# Patient Record
Sex: Female | Born: 1980 | Hispanic: Yes | Marital: Married | Smoking: Never smoker
Health system: Southern US, Community
[De-identification: ages and names within clinical notes are randomized; demographics above are authoritative.]

## PROBLEM LIST (undated history)

## (undated) DIAGNOSIS — O0930 Supervision of pregnancy with insufficient antenatal care, unspecified trimester: Secondary | ICD-10-CM

## (undated) DIAGNOSIS — K8020 Calculus of gallbladder without cholecystitis without obstruction: Secondary | ICD-10-CM

## (undated) HISTORY — PX: NO PAST SURGERIES: SHX2092

---

## 2004-11-08 ENCOUNTER — Inpatient Hospital Stay (HOSPITAL_COMMUNITY): Admission: AD | Admit: 2004-11-08 | Discharge: 2004-11-10 | Payer: Self-pay | Admitting: Obstetrics and Gynecology

## 2004-11-08 ENCOUNTER — Ambulatory Visit: Payer: Self-pay | Admitting: *Deleted

## 2010-04-17 ENCOUNTER — Inpatient Hospital Stay (HOSPITAL_COMMUNITY)
Admission: AD | Admit: 2010-04-17 | Discharge: 2010-04-17 | Disposition: A | Discharge status: Home / Self Care | Payer: Self-pay | Source: Ambulatory Visit | Attending: Obstetrics | Admitting: Obstetrics

## 2010-04-17 DIAGNOSIS — O368190 Decreased fetal movements, unspecified trimester, not applicable or unspecified: Secondary | ICD-10-CM | POA: Insufficient documentation

## 2010-06-04 ENCOUNTER — Inpatient Hospital Stay (HOSPITAL_COMMUNITY)
Admission: RE | Admit: 2010-06-04 | Discharge: 2010-06-06 | DRG: 775 | Disposition: A | Discharge status: Home / Self Care | Payer: Medicaid Other | Source: Ambulatory Visit | Attending: Obstetrics | Admitting: Obstetrics

## 2010-06-04 LAB — RPR: RPR Ser Ql: NONREACTIVE

## 2010-06-04 LAB — CBC
Hemoglobin: 10.5 g/dL — ABNORMAL LOW (ref 12.0–15.0)
Platelets: 181 10*3/uL (ref 150–400)
RBC: 3.88 MIL/uL (ref 3.87–5.11)
WBC: 6.3 10*3/uL (ref 4.0–10.5)

## 2010-06-05 LAB — CBC
Hemoglobin: 8.9 g/dL — ABNORMAL LOW (ref 12.0–15.0)
RBC: 3.29 MIL/uL — ABNORMAL LOW (ref 3.87–5.11)

## 2011-07-21 ENCOUNTER — Emergency Department (HOSPITAL_COMMUNITY)
Admission: EM | Admit: 2011-07-21 | Discharge: 2011-07-21 | Disposition: A | Discharge status: Home / Self Care | Payer: Self-pay | Attending: Emergency Medicine | Admitting: Emergency Medicine

## 2011-07-21 ENCOUNTER — Encounter (HOSPITAL_COMMUNITY): Payer: Self-pay | Admitting: Emergency Medicine

## 2011-07-21 DIAGNOSIS — R109 Unspecified abdominal pain: Secondary | ICD-10-CM | POA: Insufficient documentation

## 2011-07-21 DIAGNOSIS — N390 Urinary tract infection, site not specified: Secondary | ICD-10-CM | POA: Insufficient documentation

## 2011-07-21 LAB — POCT I-STAT, CHEM 8
Calcium, Ion: 1.13 mmol/L (ref 1.12–1.32)
Chloride: 104 mEq/L (ref 96–112)
HCT: 37 % (ref 36.0–46.0)
Potassium: 3.1 mEq/L — ABNORMAL LOW (ref 3.5–5.1)

## 2011-07-21 LAB — URINALYSIS, ROUTINE W REFLEX MICROSCOPIC
Glucose, UA: NEGATIVE mg/dL
Specific Gravity, Urine: 1.018 (ref 1.005–1.030)
pH: 6 (ref 5.0–8.0)

## 2011-07-21 LAB — URINE MICROSCOPIC-ADD ON

## 2011-07-21 LAB — POCT PREGNANCY, URINE: Preg Test, Ur: NEGATIVE

## 2011-07-21 LAB — WET PREP, GENITAL
Clue Cells Wet Prep HPF POC: NONE SEEN
Trich, Wet Prep: NONE SEEN
Yeast Wet Prep HPF POC: NONE SEEN

## 2011-07-21 MED ORDER — PHENAZOPYRIDINE HCL 200 MG PO TABS: 200.0000 mg | ORAL_TABLET | Freq: Three times a day (TID) | ORAL | Status: AC

## 2011-07-21 MED ORDER — CIPROFLOXACIN HCL 500 MG PO TABS: 500.0000 mg | ORAL_TABLET | Freq: Two times a day (BID) | ORAL | Status: AC

## 2011-07-21 MED ORDER — POTASSIUM CHLORIDE CRYS ER 20 MEQ PO TBCR
EXTENDED_RELEASE_TABLET | ORAL | Status: AC
  Filled 2011-07-21: qty 2

## 2011-07-21 MED ORDER — POTASSIUM CHLORIDE CRYS ER 20 MEQ PO TBCR
40.0000 meq | EXTENDED_RELEASE_TABLET | Freq: Once | ORAL | Status: AC
  Administered 2011-07-21: 40 meq via ORAL

## 2011-07-21 NOTE — ED Notes (Signed)
Using interpreter services [phone]. Pt c/o feeling abdominal cramping like her period is going to start but does not happen. Pt took motrin x 2 yesterday without relief. Pt reports nausea but does not vomit.

## 2011-07-21 NOTE — ED Provider Notes (Signed)
History     CSN: [id number]  Arrival date & time 07/21/11  1201   First MD Initiated Contact with Patient 07/21/11 1309      Chief Complaint  Patient presents with  . Abdominal Pain    (Consider location/radiation/quality/duration/timing/severity/associated sxs/prior treatment) HPI  Patient presents to the emergency department with complaints of abdominal cramping and bilateral lower abdomen. She states it feels like she's going to start her. She's only having mild bleeding. The patient is concerned that she may be pregnant and has not taken a pregnancy test at home. The patient took Motrin this morning which helped relieve the pain. She currently is having no pain. She denies history of abdominal pains or surgeries. Pt in NAD and VSS.  History reviewed. No pertinent past medical history.  History reviewed. No pertinent past surgical history.  History reviewed. No pertinent family history.  History  Substance Use Topics  . Smoking status: Never Smoker   . Smokeless tobacco: Not on file  . Alcohol Use: No    OB History    Grav Para Term Preterm Abortions TAB SAB Ect Mult Living                  Review of Systems  HEENT: denies blurry vision or change in hearing PULMONARY: Denies difficulty breathing and SOB CARDIAC: denies chest pain or heart palpitations MUSCULOSKELETAL:  denies being unable to ambulate ABDOMEN AL: denies abdominal pain, N,V,D GU: denies loss of bowel or urinary control NEURO: denies numbness and tingling in extremities SKIN: no new rashes PSYCH: patient denies anxiety or depression. NECK: Pt denies having neck pain    Allergies  Review of patient's allergies indicates no known allergies.  Home Medications   Current Outpatient Rx  Name Route Sig Dispense Refill  . MOTRIN PO Oral Take 2 tablets by mouth every 8 (eight) hours as needed. For pain    . CIPROFLOXACIN HCL 500 MG PO TABS Oral Take 1 tablet (500 mg total) by mouth 2 (two) times  daily. 14 tablet 0  . PHENAZOPYRIDINE HCL 200 MG PO TABS Oral Take 1 tablet (200 mg total) by mouth 3 (three) times daily. 6 tablet 0    BP 106/70  Pulse 100  Temp 98.9 F (37.2 C) (Oral)  Resp 16  SpO2 99%  LMP 06/16/2011  Physical Exam  Nursing note and vitals reviewed. Constitutional: She appears well-developed and well-nourished. No distress.  HENT:  Head: Normocephalic and atraumatic.  Eyes: Pupils are equal, round, and reactive to light.  Neck: Normal range of motion. Neck supple.  Cardiovascular: Normal rate and regular rhythm.   Pulmonary/Chest: Effort normal.  Abdominal: Soft. She exhibits no distension. There is no tenderness. There is no rebound and no guarding.  Neurological: She is alert.  Skin: Skin is warm and dry.    ED Course  Procedures (including critical care time)  Labs Reviewed  URINALYSIS, ROUTINE W REFLEX MICROSCOPIC - Abnormal; Notable for the following:    APPearance TURBID (*)     Hgb urine dipstick LARGE (*)     Bilirubin Urine SMALL (*)     Ketones, ur 15 (*)     Protein, ur 100 (*)     Nitrite POSITIVE (*)     Leukocytes, UA LARGE (*)     All other components within normal limits  URINE MICROSCOPIC-ADD ON - Abnormal; Notable for the following:    Squamous Epithelial / LPF MANY (*)     Bacteria, UA MANY (*)  Casts GRANULAR CAST (*)     All other components within normal limits  POCT I-STAT, CHEM 8 - Abnormal; Notable for the following:    Potassium 3.1 (*)     BUN 5 (*)     Glucose, Bld 124 (*)     All other components within normal limits  POCT PREGNANCY, URINE  WET PREP, GENITAL  GC/CHLAMYDIA PROBE AMP, GENITAL  URINE CULTURE   No results found.   1. UTI (lower urinary tract infection)       MDM  Pt has UTI, her labs and work-up otherwise benign, pregnancy test is negative.  Pt given Rx for Cipro and pyridium. I discussed the patients glucose level with her and have given her a referral to an OB/Gyn to have her sugar  and urine rechecked. Also, because patient is concerned she needs a PAP.  Pt has been advised of the symptoms that warrant their return to the ED. Patient has voiced understanding and has agreed to follow-up with the PCP or specialist.         Dorthula Matas, PA 07/21/11 1459

## 2011-07-22 NOTE — ED Provider Notes (Signed)
Medical screening examination/treatment/procedure(s) were performed by non-physician practitioner and as supervising physician I was immediately available for consultation/collaboration.  Kasin Tonkinson, MD 07/22/11 1327 

## 2011-07-23 LAB — URINE CULTURE: Colony Count: >=100000

## 2011-07-24 NOTE — ED Notes (Signed)
+  Urine. Patient treated with Cipro. Sensitive to same. Per protocol MD. °

## 2011-09-28 ENCOUNTER — Emergency Department (HOSPITAL_COMMUNITY)
Admission: EM | Admit: 2011-09-28 | Discharge: 2011-09-28 | Disposition: A | Discharge status: Home / Self Care | Payer: Self-pay | Attending: Emergency Medicine | Admitting: Emergency Medicine

## 2011-09-28 ENCOUNTER — Other Ambulatory Visit (HOSPITAL_COMMUNITY): Payer: Medicaid Other

## 2011-09-28 ENCOUNTER — Emergency Department (HOSPITAL_COMMUNITY): Payer: Self-pay

## 2011-09-28 ENCOUNTER — Encounter (HOSPITAL_COMMUNITY): Payer: Self-pay | Admitting: Physical Medicine and Rehabilitation

## 2011-09-28 DIAGNOSIS — Z3490 Encounter for supervision of normal pregnancy, unspecified, unspecified trimester: Secondary | ICD-10-CM

## 2011-09-28 DIAGNOSIS — R109 Unspecified abdominal pain: Secondary | ICD-10-CM

## 2011-09-28 DIAGNOSIS — O2690 Pregnancy related conditions, unspecified, unspecified trimester: Secondary | ICD-10-CM | POA: Insufficient documentation

## 2011-09-28 DIAGNOSIS — R319 Hematuria, unspecified: Secondary | ICD-10-CM | POA: Insufficient documentation

## 2011-09-28 LAB — CBC WITH DIFFERENTIAL/PLATELET
Basophils Absolute: 0 10*3/uL (ref 0.0–0.1)
Eosinophils Relative: 2 % (ref 0–5)
HCT: 36.2 % (ref 36.0–46.0)
Lymphocytes Relative: 23 % (ref 12–46)
Lymphs Abs: 1.8 10*3/uL (ref 0.7–4.0)
MCV: 83 fL (ref 78.0–100.0)
Monocytes Absolute: 0.3 10*3/uL (ref 0.1–1.0)
RDW: 14.8 % (ref 11.5–15.5)
WBC: 7.9 10*3/uL (ref 4.0–10.5)

## 2011-09-28 LAB — HCG, QUANTITATIVE, PREGNANCY: hCG, Beta Chain, Quant, S: 23613 m[IU]/mL — ABNORMAL HIGH (ref <5)

## 2011-09-28 LAB — URINALYSIS, ROUTINE W REFLEX MICROSCOPIC
Nitrite: NEGATIVE
Specific Gravity, Urine: 1.021 (ref 1.005–1.030)
Urobilinogen, UA: 0.2 mg/dL (ref 0.0–1.0)

## 2011-09-28 LAB — URINE MICROSCOPIC-ADD ON

## 2011-09-28 LAB — COMPREHENSIVE METABOLIC PANEL
BUN: 7 mg/dL (ref 6–23)
CO2: 23 mEq/L (ref 19–32)
Calcium: 8.8 mg/dL (ref 8.4–10.5)
Creatinine, Ser: 0.44 mg/dL — ABNORMAL LOW (ref 0.50–1.10)
GFR calc Af Amer: >90 mL/min (ref >90)
GFR calc non Af Amer: >90 mL/min (ref >90)
Glucose, Bld: 107 mg/dL — ABNORMAL HIGH (ref 70–99)

## 2011-09-28 LAB — WET PREP, GENITAL

## 2011-09-28 MED ORDER — HYDROCODONE-ACETAMINOPHEN 5-500 MG PO TABS: 1.0000 | ORAL_TABLET | Freq: Four times a day (QID) | ORAL | Status: AC | PRN

## 2011-09-28 MED ORDER — ONDANSETRON HCL 4 MG/2ML IJ SOLN
4.0000 mg | Freq: Once | INTRAMUSCULAR | Status: AC
  Administered 2011-09-28: 4 mg via INTRAVENOUS
  Filled 2011-09-28: qty 2

## 2011-09-28 NOTE — ED Notes (Signed)
Pt presents to department for evaluation of lower abdominal pain, localized to RLQ. Ongoing since 08:30 this morning. States 10/10 pressure sensation. Denies urinary symptoms. Denies vaginal bleeding. Was seen at health department recently, she is x10weeks pregnant. Lower abdomen tender to palpation. Bowel sounds present all quadrants. She is alert and oriented x4. Husband at bedside to translate. No signs of distress noted at the time.

## 2011-09-28 NOTE — ED Notes (Signed)
Pt presents to department for evaluation of lower abdominal pain. Onset this morning @08 :30. 10/10 pain upon arrival. Also states nausea. She is x10weeks pregnant. LMP: 07/12/2011. Last BM this morning. Denies urinary symptoms. She is conscious alert and oriented x4. Husband at bedside to translate.

## 2011-09-28 NOTE — ED Provider Notes (Signed)
History     CSN: [id number]  Arrival date & time 09/28/11  7829   First MD Initiated Contact with Patient 09/28/11 [phone]      Chief Complaint  Patient presents with  . Abdominal Pain    (Consider location/radiation/quality/duration/timing/severity/associated sxs/prior treatment) Patient is a 31 y.o. female presenting with abdominal pain. The history is provided by the patient.  Abdominal Pain The primary symptoms of the illness include abdominal pain, nausea and vomiting. The primary symptoms of the illness do not include fever, shortness of breath, diarrhea, hematemesis, hematochezia, dysuria, vaginal discharge or vaginal bleeding. The current episode started 1 to 2 hours ago. The onset of the illness was sudden.  Symptoms associated with the illness do not include chills, frequency or back pain.  Pt states she thinks she is about [redacted]wks pregnant. States pain started this morning, is on the right lower abdomen. States pain was 10/10, but states she just vomited and pain now 1/10. Pt denies urinary or vaginal symptoms. No diarrhea.   No past medical history on file.  No past surgical history on file.  No family history on file.  History  Substance Use Topics  . Smoking status: Never Smoker   . Smokeless tobacco: Not on file  . Alcohol Use: No    OB History    Grav Para Term Preterm Abortions TAB SAB Ect Mult Living                  Review of Systems  Constitutional: Negative for fever and chills.  HENT: Negative.   Respiratory: Negative for chest tightness and shortness of breath.   Cardiovascular: Negative.   Gastrointestinal: Positive for nausea, vomiting and abdominal pain. Negative for diarrhea, hematochezia and hematemesis.  Genitourinary: Negative for dysuria, frequency, flank pain, vaginal bleeding and vaginal discharge.  Musculoskeletal: Negative for back pain.  Neurological: Negative for dizziness, weakness, light-headedness and headaches.    Allergies    Review of patient's allergies indicates no known allergies.  Home Medications   Current Outpatient Rx  Name Route Sig Dispense Refill  . FOLIC ACID 1 MG PO TABS Oral Take 1 mg by mouth daily.      BP 114/68  Pulse 56  Temp 98 F (36.7 C) (Oral)  Resp 18  SpO2 100%  Physical Exam  Constitutional: She is oriented to person, place, and time. She appears well-developed and well-nourished. No distress.  Eyes: Conjunctivae are normal.  Neck: Neck supple.  Cardiovascular: Normal rate, regular rhythm and normal heart sounds.   Pulmonary/Chest: Effort normal and breath sounds normal. No respiratory distress. She has no wheezes. She has no rales.  Abdominal: Soft. Bowel sounds are normal. She exhibits no distension. There is no tenderness. There is no rebound.  Genitourinary:       Normal external genitalia. Normal vaginal canal. Cervix normal. Uterus gravid. No CMT  Musculoskeletal: She exhibits no edema.  Neurological: She is alert and oriented to person, place, and time.  Skin: Skin is warm and dry.  Psychiatric: She has a normal mood and affect.    ED Course  Procedures (including critical care time)  Pt possibly pregnant with RLQ pain. Pt vomitted and now states pain completely resolved. Will get labs, pelvic exam, UA.  Results for orders placed during the hospital encounter of 09/28/11  CBC WITH DIFFERENTIAL      Component Value Range   WBC 7.9  4.0 - 10.5 K/uL   RBC 4.36  3.87 - 5.11 MIL/uL  Hemoglobin 12.3  12.0 - 15.0 g/dL   HCT 65.7  84.6 - 96.2 %   MCV 83.0  78.0 - 100.0 fL   MCH 28.2  26.0 - 34.0 pg   MCHC 34.0  30.0 - 36.0 g/dL   RDW 95.2  84.1 - 32.4 %   Platelets 177  150 - 400 K/uL   Neutrophils Relative 71  43 - 77 %   Neutro Abs 5.6  1.7 - 7.7 K/uL   Lymphocytes Relative 23  12 - 46 %   Lymphs Abs 1.8  0.7 - 4.0 K/uL   Monocytes Relative 4  3 - 12 %   Monocytes Absolute 0.3  0.1 - 1.0 K/uL   Eosinophils Relative 2  0 - 5 %   Eosinophils Absolute 0.1   0.0 - 0.7 K/uL   Basophils Relative 0  0 - 1 %   Basophils Absolute 0.0  0.0 - 0.1 K/uL  COMPREHENSIVE METABOLIC PANEL      Component Value Range   Sodium 136  135 - 145 mEq/L   Potassium 4.3  3.5 - 5.1 mEq/L   Chloride 103  96 - 112 mEq/L   CO2 23  19 - 32 mEq/L   Glucose, Bld 107 (*) 70 - 99 mg/dL   BUN 7  6 - 23 mg/dL   Creatinine, Ser 4.01 (*) 0.50 - 1.10 mg/dL   Calcium 8.8  8.4 - 02.7 mg/dL   Total Protein 7.0  6.0 - 8.3 g/dL   Albumin 3.4 (*) 3.5 - 5.2 g/dL   AST 23  0 - 37 U/L   ALT 18  0 - 35 U/L   Alkaline Phosphatase 53  39 - 117 U/L   Total Bilirubin 0.3  0.3 - 1.2 mg/dL   GFR calc non Af Amer >90  >90 mL/min   GFR calc Af Amer >90  >90 mL/min  URINALYSIS, ROUTINE W REFLEX MICROSCOPIC      Component Value Range   Color, Urine YELLOW  YELLOW   APPearance HAZY (*) CLEAR   Specific Gravity, Urine 1.021  1.005 - 1.030   pH 6.5  5.0 - 8.0   Glucose, UA NEGATIVE  NEGATIVE mg/dL   Hgb urine dipstick LARGE (*) NEGATIVE   Bilirubin Urine NEGATIVE  NEGATIVE   Ketones, ur NEGATIVE  NEGATIVE mg/dL   Protein, ur NEGATIVE  NEGATIVE mg/dL   Urobilinogen, UA 0.2  0.0 - 1.0 mg/dL   Nitrite NEGATIVE  NEGATIVE   Leukocytes, UA SMALL (*) NEGATIVE  WET PREP, GENITAL      Component Value Range   Yeast Wet Prep HPF POC NONE SEEN  NONE SEEN   Trich, Wet Prep NONE SEEN  NONE SEEN   Clue Cells Wet Prep HPF POC FEW (*) NONE SEEN   WBC, Wet Prep HPF POC MANY (*) NONE SEEN  POCT PREGNANCY, URINE      Component Value Range   Preg Test, Ur POSITIVE (*) NEGATIVE  ABO/RH      Component Value Range   ABO/RH(D) A POS    HCG, QUANTITATIVE, PREGNANCY      Component Value Range   hCG, Beta Chain, Quant, S 25366 (*) <5 mIU/mL  URINE MICROSCOPIC-ADD ON      Component Value Range   Squamous Epithelial / LPF FEW (*) RARE   WBC, UA 3-6  <3 WBC/hpf   RBC / HPF 11-20  <3 RBC/hpf   Bacteria, UA RARE  RARE   Urine-Other MUCOUS PRESENT  US Ob Comp Less 14 Wks  09/28/2011  *RADIOLOGY REPORT*   Clinical Data: ABDOMINAL PAIN; ;  OBSTETRIC <14 WK [place] AND TRANSVAGINAL OB US  Technique: Both transabdominal and transvaginal ultrasound examinations were performed for complete evaluation of the gestation as well as the maternal uterus, adnexal regions, and pelvic cul-de-sac.  Comparison: None  Findings: There is a single intrauterine gestation.  Based on crown- rump length, estimated gestational age is 12 weeks 4 days.  Fetal heart rate 143 beats per minute.  No subchorionic hemorrhage. Focal uterine contraction noted in the fundal area.  No adnexal masses.  Ovaries are symmetric size and echo texture. No free fluid.  IMPRESSION: 12-week-4-day intrauterine pregnancy.  Fetal heart rate 143 beats per minute.   Original Report Authenticated By: Cyndie Chime, M.D.     US obtained to r/o ectopic pregnanacy. [place] confirms live intrauterine pregnancy. Will d/c home with OB/GYN follow up. I suspect pt may have had a kidney stone based on RBCs in urine, and pain completely resolved with no medications. Interpreter phone used to discuss, results, plan, and pt voiced understanding.   1. Abdominal pain   2. Hematuria   3. Intrauterine pregnancy       MDM          Lottie Mussel, PA 09/28/11 [phone]

## 2011-09-29 LAB — GC/CHLAMYDIA PROBE AMP, GENITAL
Chlamydia, DNA Probe: NEGATIVE
GC Probe Amp, Genital: NEGATIVE

## 2011-09-29 NOTE — ED Provider Notes (Signed)
Medical screening examination/treatment/procedure(s) were performed by non-physician practitioner and as supervising physician I was immediately available for consultation/collaboration.   Carleene Cooper III, MD 09/29/11 1040

## 2011-12-30 ENCOUNTER — Other Ambulatory Visit: Payer: Self-pay | Admitting: Family Medicine

## 2011-12-30 DIAGNOSIS — Z3689 Encounter for other specified antenatal screening: Secondary | ICD-10-CM

## 2011-12-31 ENCOUNTER — Ambulatory Visit (HOSPITAL_COMMUNITY)
Admission: RE | Admit: 2011-12-31 | Discharge: 2011-12-31 | Disposition: A | Discharge status: Home / Self Care | Payer: Medicaid Other | Source: Ambulatory Visit | Attending: Family Medicine | Admitting: Family Medicine

## 2011-12-31 DIAGNOSIS — Z3689 Encounter for other specified antenatal screening: Secondary | ICD-10-CM | POA: Insufficient documentation

## 2012-01-20 NOTE — L&D Delivery Note (Signed)
Delivery Note At 6:18 PM a healthy female was delivered via Vaginal, Spontaneous Delivery (Presentation: Right Occiput Anterior).  APGAR: 8, 9; weight .   Placenta status: Intact, Spontaneous.  Cord: 3 vessels with the following complications: None.  Cord pH: n/a  Anesthesia: None  Episiotomy: None Lacerations: None Suture Repair: None Est. Blood Loss (mL): 300  Mom to postpartum.  Baby to nursery-stable.  Kevin Fenton 04/08/2012, 7:25 PM

## 2012-01-20 NOTE — L&D Delivery Note (Signed)
I was present for entire delivery and agree with above note. Napoleon Form, MD

## 2012-04-08 ENCOUNTER — Inpatient Hospital Stay (HOSPITAL_COMMUNITY)
Admission: AD | Admit: 2012-04-08 | Discharge: 2012-04-09 | DRG: 775 | Disposition: A | Discharge status: Home / Self Care | Payer: Medicaid Other | Source: Ambulatory Visit | Attending: Obstetrics and Gynecology | Admitting: Obstetrics and Gynecology

## 2012-04-08 ENCOUNTER — Encounter (HOSPITAL_COMMUNITY): Payer: Self-pay

## 2012-04-08 ENCOUNTER — Inpatient Hospital Stay (HOSPITAL_COMMUNITY): Payer: Medicaid Other

## 2012-04-08 HISTORY — DX: Calculus of gallbladder without cholecystitis without obstruction: K80.20

## 2012-04-08 HISTORY — DX: Supervision of pregnancy with insufficient antenatal care, unspecified trimester: O09.30

## 2012-04-08 LAB — TYPE AND SCREEN
ABO/RH(D): A POS
Antibody Screen: NEGATIVE

## 2012-04-08 LAB — RPR: RPR Ser Ql: NONREACTIVE

## 2012-04-08 LAB — CBC
MCH: 28.2 pg (ref 26.0–34.0)
MCHC: 33 g/dL (ref 30.0–36.0)
Platelets: 172 10*3/uL (ref 150–400)

## 2012-04-08 LAB — ABO/RH: ABO/RH(D): A POS

## 2012-04-08 MED ORDER — LACTATED RINGERS IV SOLN
500.0000 mL | INTRAVENOUS | Status: DC | PRN
  Administered 2012-04-08: 1000 mL via INTRAVENOUS

## 2012-04-08 MED ORDER — ONDANSETRON HCL 4 MG/2ML IJ SOLN: 4.0000 mg | Freq: Four times a day (QID) | INTRAMUSCULAR | Status: DC | PRN

## 2012-04-08 MED ORDER — NALBUPHINE SYRINGE 5 MG/0.5 ML
10.0000 mg | INJECTION | INTRAMUSCULAR | Status: DC | PRN
  Filled 2012-04-08: qty 1

## 2012-04-08 MED ORDER — OXYTOCIN 40 UNITS IN LACTATED RINGERS INFUSION - SIMPLE MED
1.0000 m[IU]/min | INTRAVENOUS | Status: DC
  Administered 2012-04-08: 2 m[IU]/min via INTRAVENOUS
  Filled 2012-04-08: qty 1000

## 2012-04-08 MED ORDER — SENNOSIDES-DOCUSATE SODIUM 8.6-50 MG PO TABS
2.0000 | ORAL_TABLET | Freq: Every day | ORAL | Status: DC
  Administered 2012-04-08: 2 via ORAL

## 2012-04-08 MED ORDER — OXYCODONE-ACETAMINOPHEN 5-325 MG PO TABS: 1.0000 | ORAL_TABLET | ORAL | Status: DC | PRN

## 2012-04-08 MED ORDER — LACTATED RINGERS IV SOLN
INTRAVENOUS | Status: DC
  Administered 2012-04-08: 14:00:00 via INTRAVENOUS

## 2012-04-08 MED ORDER — PRENATAL MULTIVITAMIN CH
1.0000 | ORAL_TABLET | Freq: Every day | ORAL | Status: DC
  Administered 2012-04-09: 1 via ORAL
  Filled 2012-04-08: qty 1

## 2012-04-08 MED ORDER — IBUPROFEN 600 MG PO TABS
600.0000 mg | ORAL_TABLET | Freq: Four times a day (QID) | ORAL | Status: DC
  Administered 2012-04-08 – 2012-04-09 (×4): 600 mg via ORAL
  Filled 2012-04-08 (×4): qty 1

## 2012-04-08 MED ORDER — ACETAMINOPHEN 325 MG PO TABS: 650.0000 mg | ORAL_TABLET | ORAL | Status: DC | PRN

## 2012-04-08 MED ORDER — ONDANSETRON HCL 4 MG PO TABS: 4.0000 mg | ORAL_TABLET | ORAL | Status: DC | PRN

## 2012-04-08 MED ORDER — SIMETHICONE 80 MG PO CHEW: 80.0000 mg | CHEWABLE_TABLET | ORAL | Status: DC | PRN

## 2012-04-08 MED ORDER — FLEET ENEMA 7-19 GM/118ML RE ENEM: 1.0000 | ENEMA | RECTAL | Status: DC | PRN

## 2012-04-08 MED ORDER — CITRIC ACID-SODIUM CITRATE 334-500 MG/5ML PO SOLN: 30.0000 mL | ORAL | Status: DC | PRN

## 2012-04-08 MED ORDER — ZOLPIDEM TARTRATE 5 MG PO TABS: 5.0000 mg | ORAL_TABLET | Freq: Every evening | ORAL | Status: DC | PRN

## 2012-04-08 MED ORDER — TETANUS-DIPHTH-ACELL PERTUSSIS 5-2.5-18.5 LF-MCG/0.5 IM SUSP: 0.5000 mL | Freq: Once | INTRAMUSCULAR | Status: DC

## 2012-04-08 MED ORDER — DIBUCAINE 1 % RE OINT: 1.0000 "application " | TOPICAL_OINTMENT | RECTAL | Status: DC | PRN

## 2012-04-08 MED ORDER — DIPHENHYDRAMINE HCL 25 MG PO CAPS: 25.0000 mg | ORAL_CAPSULE | Freq: Four times a day (QID) | ORAL | Status: DC | PRN

## 2012-04-08 MED ORDER — BENZOCAINE-MENTHOL 20-0.5 % EX AERO: 1.0000 "application " | INHALATION_SPRAY | CUTANEOUS | Status: DC | PRN

## 2012-04-08 MED ORDER — LIDOCAINE HCL (PF) 1 % IJ SOLN: 30.0000 mL | INTRAMUSCULAR | Status: DC | PRN

## 2012-04-08 MED ORDER — IBUPROFEN 600 MG PO TABS: 600.0000 mg | ORAL_TABLET | Freq: Four times a day (QID) | ORAL | Status: DC | PRN

## 2012-04-08 MED ORDER — TERBUTALINE SULFATE 1 MG/ML IJ SOLN: 0.2500 mg | Freq: Once | INTRAMUSCULAR | Status: DC | PRN

## 2012-04-08 MED ORDER — LANOLIN HYDROUS EX OINT: TOPICAL_OINTMENT | CUTANEOUS | Status: DC | PRN

## 2012-04-08 MED ORDER — OXYTOCIN 40 UNITS IN LACTATED RINGERS INFUSION - SIMPLE MED: 62.5000 mL/h | INTRAVENOUS | Status: DC

## 2012-04-08 MED ORDER — ONDANSETRON HCL 4 MG/2ML IJ SOLN: 4.0000 mg | INTRAMUSCULAR | Status: DC | PRN

## 2012-04-08 MED ORDER — WITCH HAZEL-GLYCERIN EX PADS: 1.0000 "application " | MEDICATED_PAD | CUTANEOUS | Status: DC | PRN

## 2012-04-08 MED ORDER — OXYTOCIN BOLUS FROM INFUSION
500.0000 mL | INTRAVENOUS | Status: DC
  Administered 2012-04-08: 500 mL via INTRAVENOUS

## 2012-04-08 NOTE — H&P (Signed)
Teresa Ramos is a 32 y.o. female [phone] here at 40.1 sent from the health department for a prolonged decels. She denies vaginal bleeding/LOF/discharge/irritation, decreased fetal movement, and contractions. She has not had any complications during her preganancy and denies other medical problems. She had one previous pre-term delivery at 36 weeks. She denies headaches, change in vison, chest pain, dyspnea, RUQ pain, dysuria, and swelling.   History   OB History   Grav Para Term Preterm Abortions TAB SAB Ect Mult Living   4 3 2 1      3      Past Medical History  Diagnosis Date  . Preterm labor   . Late prenatal care    Past Surgical History  Procedure Laterality Date  . No past surgeries     Family History: family history is not on file. Social History:  reports that she has never smoked. She has never used smokeless tobacco. She reports that she does not drink alcohol or use illicit drugs.   Prenatal Transfer Tool  Maternal Diabetes: No Genetic Screening: Normal Maternal Ultrasounds/Referrals: Normal Fetal Ultrasounds or other Referrals:  None Maternal Substance Abuse:  No Significant Maternal Medications:  None Significant Maternal Lab Results:  Lab values include: Group B Strep negative Other Comments:  None  ROS Per HPI      Blood pressure 102/56, pulse 80, temperature 97.6 F (36.4 C), temperature source Oral, resp. rate 16, last menstrual period 06/16/2011, SpO2 100.00%. Exam Physical Exam    Gen: NAD, alert, cooperative with exam  HEENT: NCAT, EOMI, PERRL  CV: RRR, good S1/S2, no murmur  Resp: CTABL, no wheezes, non-labored  Abd: Soft, pregnant abdomen  Ext: No edema, warm  Neuro: Alert and oriented, No gross deficits   FHT: baseline 120, moderate variability, accels present, no decels Toco: irreg q 2-4 minutes  Dilation: 3 Effacement (%): 50 Station: -2 Presentation: Vertex Exam by:: Thad Ranger, MD  Prenatal labs: ABO, Rh: --/--/A POS (03/21  1410) Antibody:  negative Rubella:  immune RPR:   negative HBsAg:   negative HIV:   non-reactive GBS:   negative     Assessment/Plan: 32 y/o [id number] here at 40.1 for induction due to 6/8 BPP and prolonged decels at the health department this am.   - BPP 6/8, AFI n 30th %tile - Fetal strip category 1 - Will admit and induce due to term pregnancy with some decels and 6/8 on BPP, favorable cervix--> start pitocin - GBS negative - labor support without pain meds for now - Anticipate SVD   Kevin Fenton 04/08/2012, 1:21 PM  I saw and examined patient and reviewed prenatal records, fetal heart tracing, labs, imaging. Baby initially tachycardic in 170s but baseline returned to 120s prior to BPP. i also reviewed NST strip sent from health department with 4 minute decel. Given decel and BPP 6/8 and full term, will admit for induction and delivery. Anticipate SVD.  Napoleon Form, MD

## 2012-04-08 NOTE — MAU Note (Signed)
Pt notified by Dr. Thad Ranger and spanish interpreter that she will be admitted and induced. Pt verbalizes understanding. Awaiting room

## 2012-04-08 NOTE — Plan of Care (Signed)
Problem: Consults Goal: Postpartum Patient Education (See Patient Education module for education specifics.) Outcome: Completed/Met Date Met:  04/08/12 Had interpreter for spanish to explain booklet

## 2012-04-08 NOTE — MAU Note (Signed)
Per Eda, spanish translator, pt sent from [facility], denies pain or bleeding. No ctx's per pt.

## 2012-04-09 MED ORDER — IBUPROFEN 600 MG PO TABS: 600.0000 mg | ORAL_TABLET | Freq: Four times a day (QID) | ORAL | Status: AC

## 2012-04-09 MED ORDER — DOCUSATE SODIUM 100 MG PO CAPS: 100.0000 mg | ORAL_CAPSULE | Freq: Two times a day (BID) | ORAL | Status: DC | PRN

## 2012-04-09 NOTE — Discharge Summary (Signed)
Obstetric Discharge Summary Teresa Ramos is a 32 y.o. [id number] presenting at [redacted]w[redacted]d for NST after prolonged decel at [facility] Department appointment. Patient had 6/8 BPP and was admitted for induction of labor. She progressed rapidly on pitocin to complete dilation and SVD without epidural. Her post-partum course was unremarkable. She is breastfeeding and plans Nexplanon for contraception.  Reason for Admission: induction of labor and for non-reassuring fetal status Prenatal Procedures: NST, BPP Intrapartum Procedures: spontaneous vaginal delivery Postpartum Procedures: none Complications-Operative and Postpartum: none Hemoglobin  Date Value Range Status  04/08/2012 10.7* 12.0 - 15.0 g/dL Final     HCT  Date Value Range Status  04/08/2012 32.4* 36.0 - 46.0 % Final    Physical Exam:  General: alert, cooperative and no distress Lochia: appropriate Uterine Fundus: firm Incision: n/a DVT Evaluation: No evidence of DVT seen on physical exam. Negative Homan's sign. No cords or calf tenderness. No significant calf/ankle edema.  Discharge Diagnoses: Term Pregnancy-delivered  Discharge Information: Date: 04/09/2012 Activity: pelvic rest Diet: routine Medications: PNV, Ibuprofen and Colace Condition: stable Instructions: refer to practice specific booklet Discharge to: home Follow-up Information   Follow up with [facility] (as previously scheduled)    Contact information:   [address] [place] [address] [phone]      Newborn Data: Live born female  Birth Weight: 7 lb 15 oz (3600 g) APGAR: 8, 9  Home with mother.  Napoleon Form 04/09/2012, 8:59 PM

## 2012-04-09 NOTE — Progress Notes (Signed)
Post Partum Day #1 Subjective: no complaints and up ad lib; breastfeeding but reports 'no milk'; desires Nexplanon for contraception  Objective: Blood pressure 100/52, pulse 72, temperature 97.9 F (36.6 C), temperature source Oral, resp. rate 18, height 5\' 2"  (1.575 m), weight 191 lb (86.637 kg), last menstrual period 06/16/2011, SpO2 99.00%, unknown if currently breastfeeding.  Physical Exam:  General: alert, cooperative and no distress Lochia: appropriate Uterine Fundus: firm DVT Evaluation: No evidence of DVT seen on physical exam.   Recent Labs  04/08/12 1410  HGB 10.7*  HCT 32.4*    Assessment/Plan: Plan for discharge tomorrow and Lactation consult Educated re breastfeeding/milk supply   LOS: 1 day   Cam Hai 04/09/2012, 7:31 AM

## 2012-04-11 NOTE — Progress Notes (Signed)
Post discharge chart review completed.  

## 2013-11-20 ENCOUNTER — Encounter (HOSPITAL_COMMUNITY): Payer: Self-pay

## 2019-03-24 ENCOUNTER — Other Ambulatory Visit: Payer: Self-pay

## 2019-03-24 DIAGNOSIS — Z1231 Encounter for screening mammogram for malignant neoplasm of breast: Secondary | ICD-10-CM

## 2019-06-15 ENCOUNTER — Inpatient Hospital Stay: Admission: RE | Admit: 2019-06-15 | Payer: Self-pay | Source: Ambulatory Visit

## 2019-06-15 ENCOUNTER — Other Ambulatory Visit: Payer: Self-pay | Admitting: Obstetrics & Gynecology

## 2019-06-15 DIAGNOSIS — Z1231 Encounter for screening mammogram for malignant neoplasm of breast: Secondary | ICD-10-CM

## 2020-08-06 ENCOUNTER — Other Ambulatory Visit: Payer: Self-pay | Admitting: Obstetrics & Gynecology

## 2020-08-06 DIAGNOSIS — Z1231 Encounter for screening mammogram for malignant neoplasm of breast: Secondary | ICD-10-CM

## 2020-09-16 ENCOUNTER — Other Ambulatory Visit: Payer: Self-pay | Admitting: Obstetrics & Gynecology

## 2020-09-16 DIAGNOSIS — Z1231 Encounter for screening mammogram for malignant neoplasm of breast: Secondary | ICD-10-CM

## 2020-09-17 ENCOUNTER — Ambulatory Visit
Admission: RE | Admit: 2020-09-17 | Discharge: 2020-09-17 | Disposition: A | Discharge status: Home / Self Care | Payer: No Typology Code available for payment source | Source: Ambulatory Visit | Attending: Obstetrics & Gynecology | Admitting: Obstetrics & Gynecology

## 2020-09-17 ENCOUNTER — Other Ambulatory Visit: Payer: Self-pay

## 2020-09-17 DIAGNOSIS — Z1231 Encounter for screening mammogram for malignant neoplasm of breast: Secondary | ICD-10-CM

## 2020-09-25 ENCOUNTER — Other Ambulatory Visit: Payer: Self-pay | Admitting: Obstetrics and Gynecology

## 2020-09-25 DIAGNOSIS — R928 Other abnormal and inconclusive findings on diagnostic imaging of breast: Secondary | ICD-10-CM

## 2020-10-10 ENCOUNTER — Encounter (INDEPENDENT_AMBULATORY_CARE_PROVIDER_SITE_OTHER): Payer: Self-pay

## 2020-10-10 ENCOUNTER — Ambulatory Visit: Payer: Self-pay | Admitting: *Deleted

## 2020-10-10 ENCOUNTER — Other Ambulatory Visit: Payer: Self-pay

## 2020-10-10 ENCOUNTER — Ambulatory Visit
Admission: RE | Admit: 2020-10-10 | Discharge: 2020-10-10 | Disposition: A | Discharge status: Home / Self Care | Payer: No Typology Code available for payment source | Source: Ambulatory Visit | Attending: Obstetrics and Gynecology | Admitting: Obstetrics and Gynecology

## 2020-10-10 VITALS — BP 110/78 | Wt 195.0 lb

## 2020-10-10 DIAGNOSIS — Z1239 Encounter for other screening for malignant neoplasm of breast: Secondary | ICD-10-CM

## 2020-10-10 DIAGNOSIS — R928 Other abnormal and inconclusive findings on diagnostic imaging of breast: Secondary | ICD-10-CM

## 2020-10-10 NOTE — Progress Notes (Signed)
Teresa Ramos is a 40 y.o. female who presents to [facility] clinic today with no complaints. Patient referred to [facility] by the [facility] due to having a screening mammogram 09/17/2020 that additional imaging of the right breast is recommended for follow-up.   Pap Smear: Pap smear not completed today. Last Pap smear was 03/21/2019 at the [facility] Department clinic and was normal with negative HPV per Berniece Salines, RN at the [facility] Department. Per patient has no history of an abnormal Pap smear. Last Pap smear result is not available in Epic.   Physical exam: Breasts Breasts symmetrical. No skin abnormalities bilateral breasts. No nipple retraction bilateral breasts. No nipple discharge bilateral breasts. No lymphadenopathy. No lumps palpated bilateral breasts. No complaints of pain or tenderness on exam.     MM 3D SCREEN BREAST BILATERAL  Result Date: 09/24/2020 CLINICAL DATA:  Screening. EXAM: DIGITAL SCREENING BILATERAL MAMMOGRAM WITH TOMOSYNTHESIS AND CAD TECHNIQUE: Bilateral screening digital craniocaudal and mediolateral oblique mammograms were obtained. Bilateral screening digital breast tomosynthesis was performed. The images were evaluated with computer-aided detection. COMPARISON:  None. ACR Breast Density Category c: The breast tissue is heterogeneously dense, which may obscure small masses. FINDINGS: In the right breast, a possible mass warrants further evaluation. In the left breast, no findings suspicious for malignancy. IMPRESSION: Further evaluation is suggested for a possible mass in the right breast. RECOMMENDATION: Diagnostic mammogram and possibly ultrasound of the right breast. (Code:[id number]) The patient will be contacted regarding the findings, and additional imaging will be scheduled. BI-RADS CATEGORY  0: Incomplete. Need additional imaging evaluation and/or prior mammograms for comparison. Electronically Signed   By: Amie Portland M.D.   On:  09/24/2020 16:42    Pelvic/Bimanual Pap is not indicated today per BCCCP guidelines.   Smoking History: Patient has never smoked.   Patient Navigation: Patient education provided. Access to services provided for patient through [facility] program. Spanish interpreter Natale Lay from [facility] provided.    Breast and Cervical Cancer Risk Assessment: Patient does not have family history of breast cancer, known genetic mutations, or radiation treatment to the chest before age 31. Patient does not have history of cervical dysplasia, immunocompromised, or DES exposure in-utero.  Risk Assessment     Risk Scores       10/10/2020   Last edited by: Meryl Dare, CMA   5-year risk: 0.3 %   Lifetime risk: 5.8 %            A: BCCCP exam without pap smear No complaints.  P: [facility] right breast per recommendation. Appointment scheduled Thursday, October 10, 2020 at 1240.  Priscille Heidelberg, RN 10/10/2020 11:08 AM

## 2020-10-10 NOTE — Patient Instructions (Signed)
Explained breast self awareness with Barnetta Chapel. Patient did not need a Pap smear today due to last Pap smear and HPV typing was 03/21/2019. Let her know BCCCP will cover Pap smears and HPV typing every 5 years unless has a history of abnormal Pap smears. Referred patient to the [facility] for a diagnostic mammogram right breast per recommendation. Appointment scheduled Thursday, October 10, 2020 at 1240. Patient aware of appointment and will be there. Dawna Raysor verbalized understanding.  Tacey Dimaggio, Kathaleen Maser, RN 11:08 AM

## 2021-09-28 IMAGING — MG MM DIGITAL DIAGNOSTIC UNILAT*R* W/ TOMO W/ CAD
4 series · 4 of 12 positions shown · non-contrast
Comparison: Previous exam(s).

ACR Breast Density Category choose 3

CLINICAL DATA: The patient was called back for a right breast mass
from baseline mammography.

EXAM:
DIGITAL DIAGNOSTIC UNILATERAL RIGHT MAMMOGRAM WITH TOMOSYNTHESIS AND
CAD; ULTRASOUND RIGHT BREAST LIMITED
TECHNIQUE: Right digital diagnostic mammography and breast tomosynthesis was
performed. The images were evaluated with computer-aided detection.;
Targeted ultrasound examination of the right breast was performed

[R MLO synth-2D]
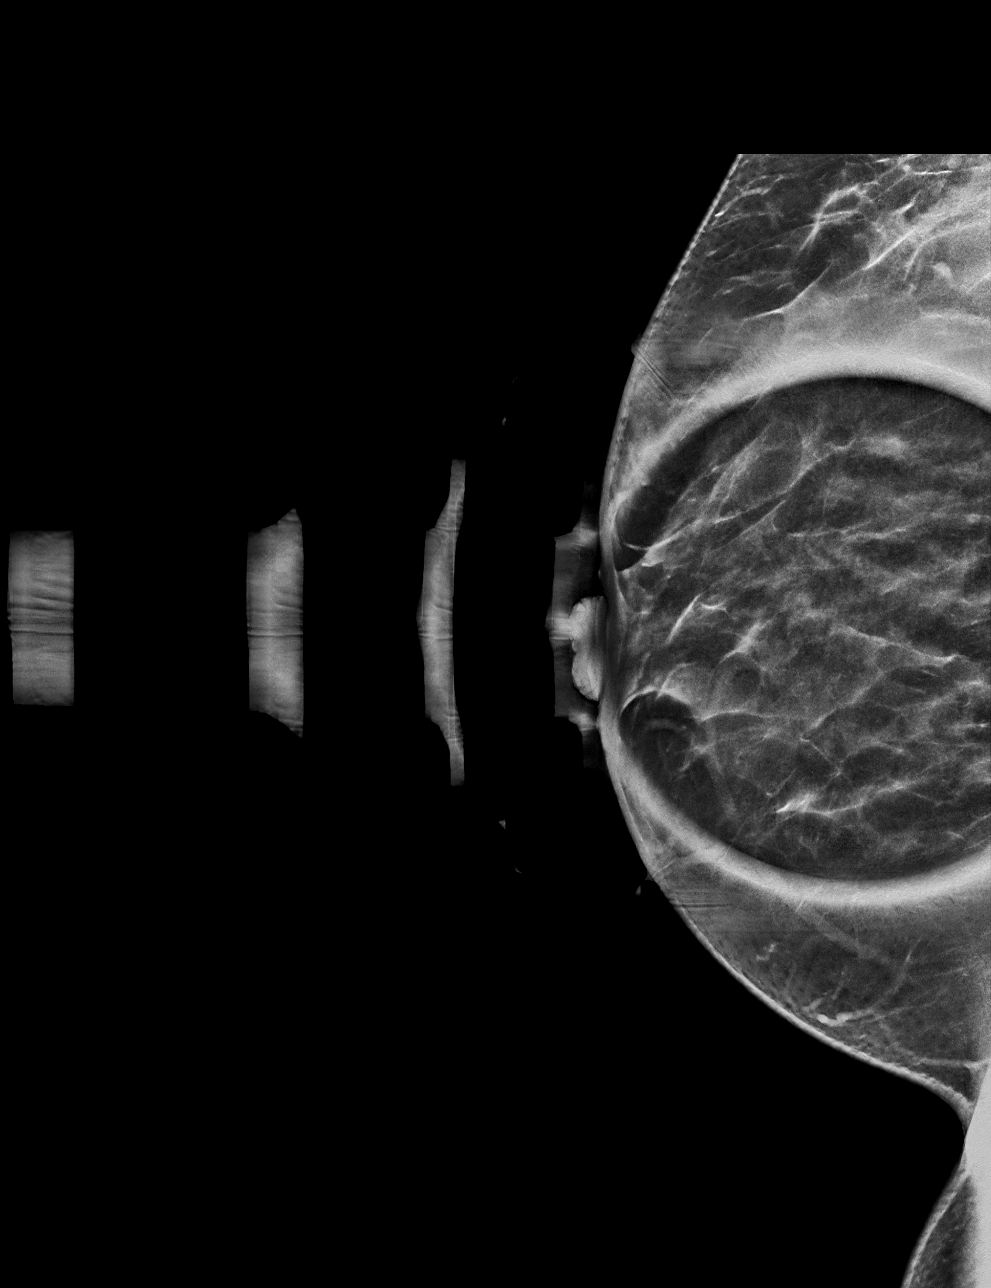

[R CC synth-2D]
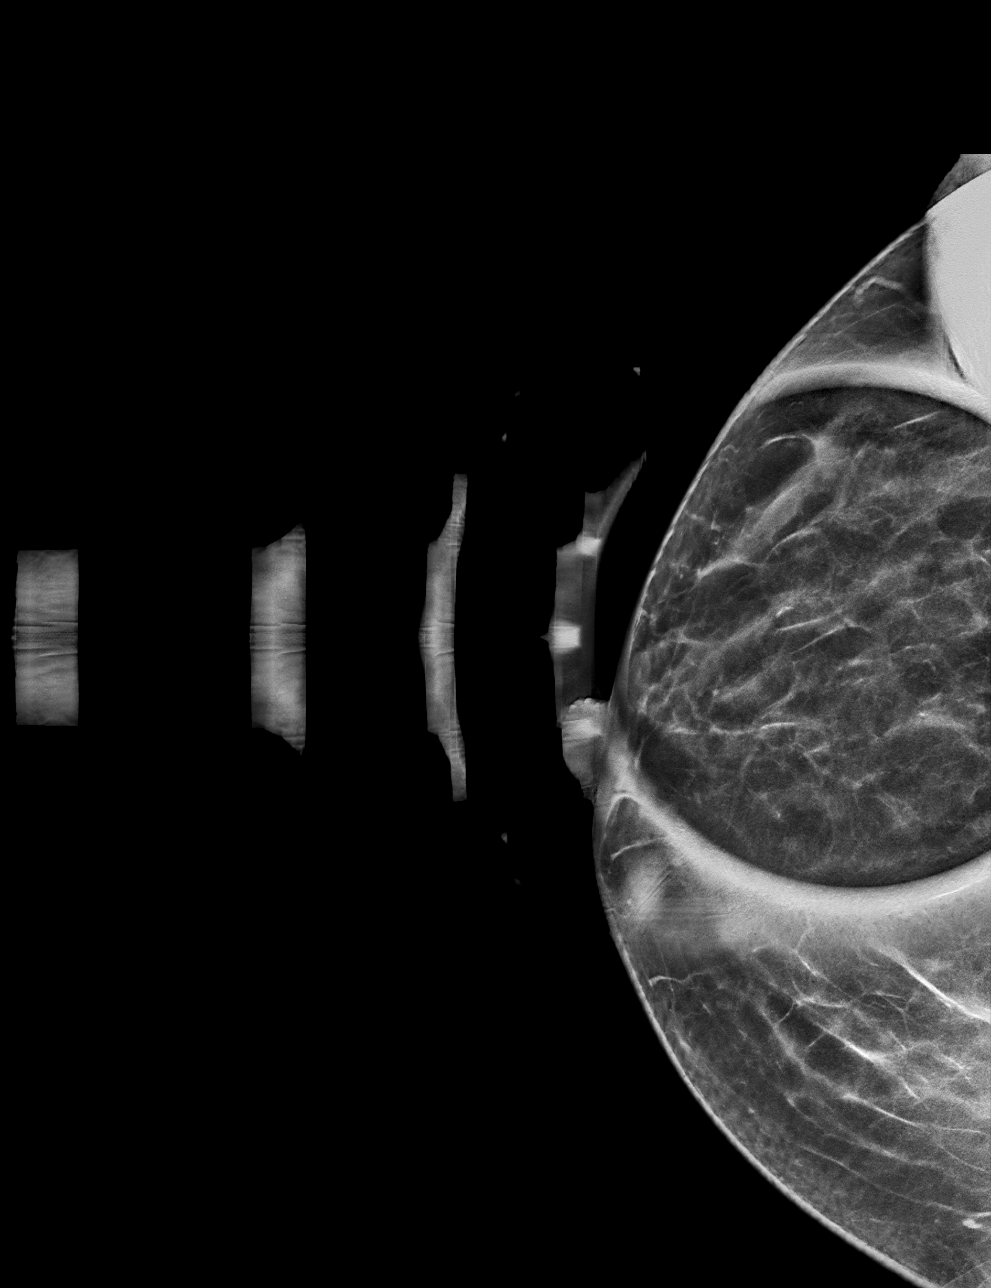

[R CC tomo · tomo slice 28/55.0]
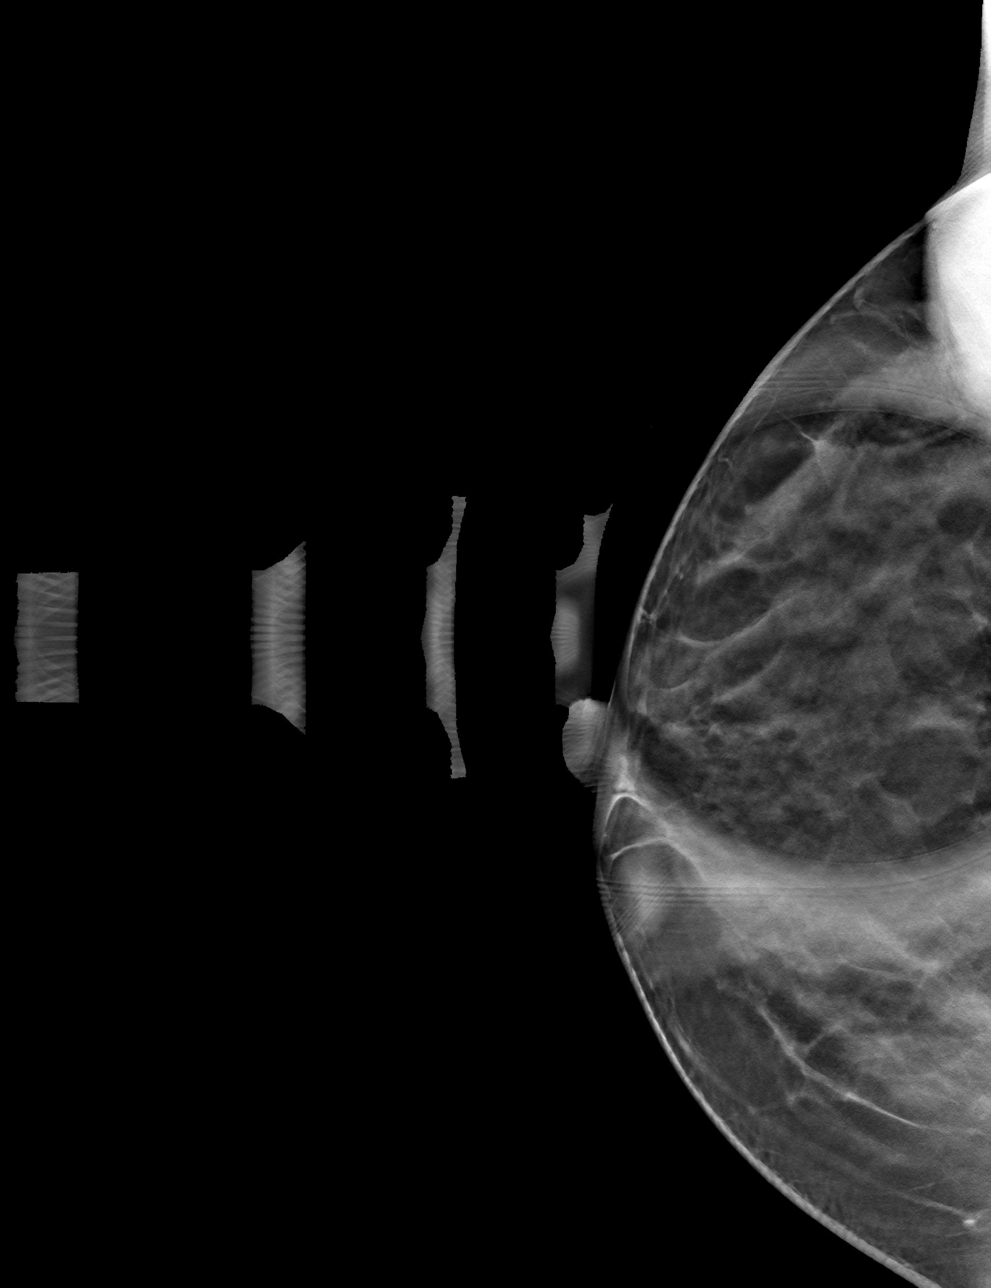

[R MLO tomo · tomo slice 29/58.0]
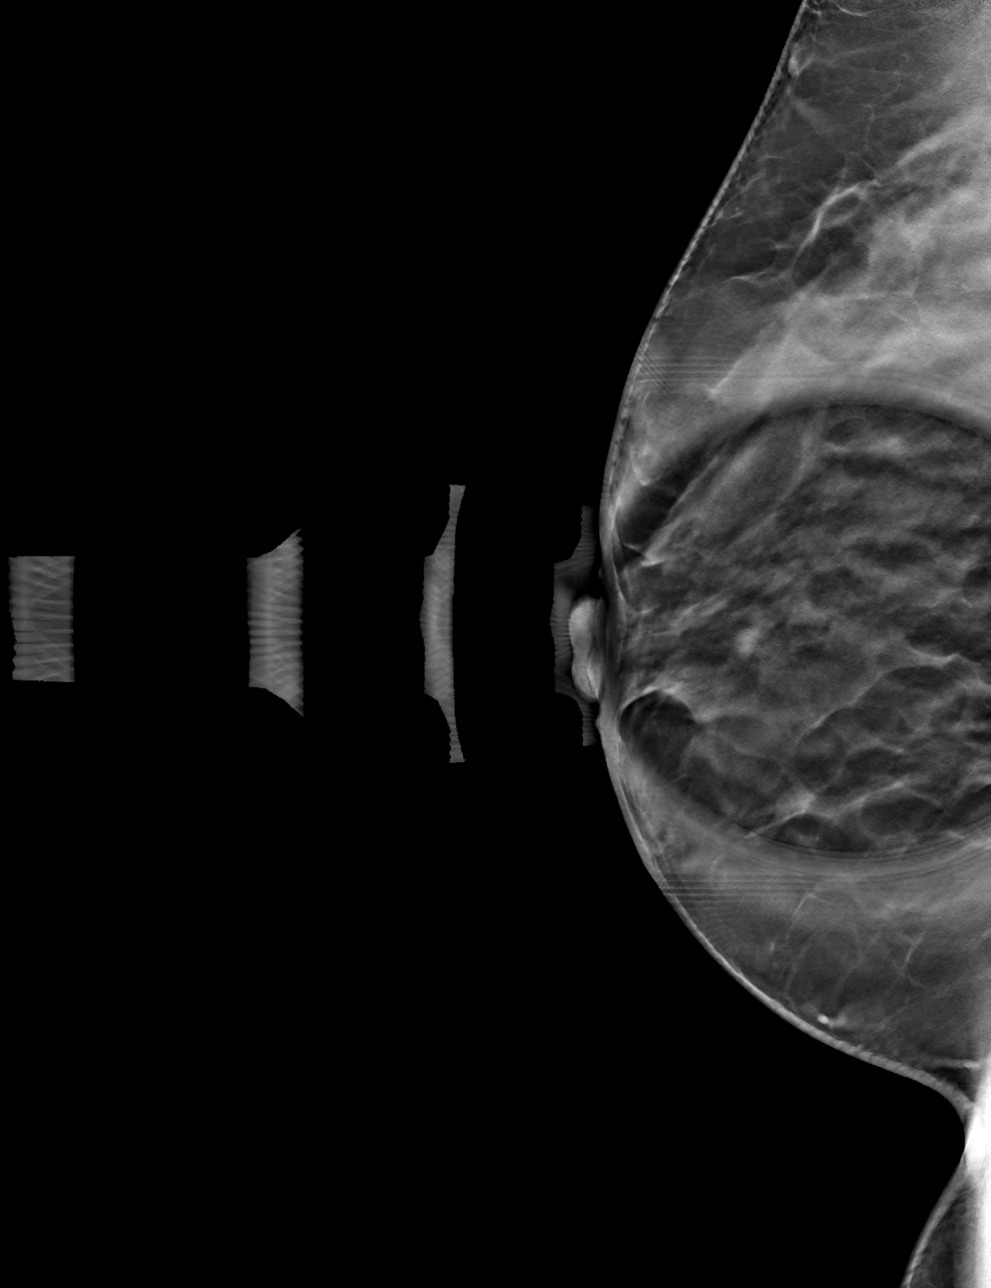

[4 of 12 positions shown; findings below may reference images not displayed]

FINDINGS: The mass in the lateral right breast persists on additional imaging.

On physical exam, no suspicious lumps are identified.

Targeted ultrasound is performed, showing a cluster of cysts in the
right breast at 8 o'clock, 3 cm from the nipple correlating with the
mammographically identified mass.
IMPRESSION: Fibrocystic changes.  No evidence of malignancy.

RECOMMENDATION:
Annual screening mammography.

I have discussed the findings and recommendations with the patient.
If applicable, a reminder letter will be sent to the patient
regarding the next appointment.

BI-RADS CATEGORY  2: Benign.

## 2023-07-28 ENCOUNTER — Encounter (HOSPITAL_COMMUNITY): Payer: Self-pay

## 2023-07-28 ENCOUNTER — Other Ambulatory Visit: Payer: Self-pay

## 2023-07-28 ENCOUNTER — Emergency Department (HOSPITAL_COMMUNITY)
Admission: EM | Admit: 2023-07-28 | Discharge: 2023-07-28 | Disposition: A | Discharge status: Home / Self Care | Attending: Emergency Medicine | Admitting: Emergency Medicine

## 2023-07-28 DIAGNOSIS — N3000 Acute cystitis without hematuria: Secondary | ICD-10-CM | POA: Insufficient documentation

## 2023-07-28 LAB — CBC
HCT: 38.9 % (ref 36.0–46.0)
Hemoglobin: 12.3 g/dL (ref 12.0–15.0)
MCH: 27.1 pg (ref 26.0–34.0)
MCHC: 31.6 g/dL (ref 30.0–36.0)
MCV: 85.7 fL (ref 80.0–100.0)
Platelets: 287 K/uL (ref 150–400)
RBC: 4.54 MIL/uL (ref 3.87–5.11)
RDW: 13.7 % (ref 11.5–15.5)
WBC: 8.3 K/uL (ref 4.0–10.5)
nRBC: 0 % (ref 0.0–0.2)

## 2023-07-28 LAB — HCG, SERUM, QUALITATIVE: Preg, Serum: NEGATIVE

## 2023-07-28 LAB — URINALYSIS, ROUTINE W REFLEX MICROSCOPIC
Bilirubin Urine: NEGATIVE
Glucose, UA: NEGATIVE mg/dL
Ketones, ur: NEGATIVE mg/dL
Nitrite: POSITIVE — AB
Protein, ur: 30 mg/dL — AB
RBC / HPF: >50 RBC/hpf (ref 0–5)
Specific Gravity, Urine: 1.02 (ref 1.005–1.030)
pH: 5 (ref 5.0–8.0)

## 2023-07-28 LAB — COMPREHENSIVE METABOLIC PANEL WITH GFR
ALT: 26 U/L (ref 0–44)
AST: 27 U/L (ref 15–41)
Albumin: 4 g/dL (ref 3.5–5.0)
Alkaline Phosphatase: 73 U/L (ref 38–126)
Anion gap: 9 (ref 5–15)
BUN: 11 mg/dL (ref 6–20)
CO2: 24 mmol/L (ref 22–32)
Calcium: 8.7 mg/dL — ABNORMAL LOW (ref 8.9–10.3)
Chloride: 103 mmol/L (ref 98–111)
Creatinine, Ser: 0.53 mg/dL (ref 0.44–1.00)
GFR, Estimated: >60 mL/min (ref >60)
Glucose, Bld: 129 mg/dL — ABNORMAL HIGH (ref 70–99)
Potassium: 3.6 mmol/L (ref 3.5–5.1)
Sodium: 136 mmol/L (ref 135–145)
Total Bilirubin: 0.8 mg/dL (ref 0.0–1.2)
Total Protein: 7.8 g/dL (ref 6.5–8.1)

## 2023-07-28 LAB — LIPASE, BLOOD: Lipase: 38 U/L (ref 11–51)

## 2023-07-28 MED ORDER — CEPHALEXIN 500 MG PO CAPS: 500.0000 mg | ORAL_CAPSULE | Freq: Four times a day (QID) | ORAL | 0 refills | Status: AC

## 2023-07-28 NOTE — ED Provider Notes (Signed)
 [place] [facility] AT [facility] Provider Note   CSN: [id number] Arrival date & time: 07/28/23  1124     Patient presents with: Abdominal Pain   Teresa Ramos is a 43 y.o. female who presents to the ED today with right lower quadrant pain that is been present for the last 30 minutes with some nausea as well.  No vomiting, denies having any dysuria or any frank hematuria, and states that she has had no bowel irregularities.  This has happened to her once before however she disregarded it as it was brief in nature however this time the pain has been persistent, is more towards the right pelvic region, and does not feel that it radiates however when she is in a bent over position she does have improvement in her pain.    Abdominal Pain      Prior to Admission medications   Medication Sig Start Date End Date Taking? Authorizing Provider  cephALEXin  (KEFLEX ) 500 MG capsule Take 1 capsule (500 mg total) by mouth 4 (four) times daily for 10 days. 07/28/23 08/07/23 Yes Myriam Dorn BROCKS, PA  ibuprofen  (ADVIL ,MOTRIN ) 600 MG tablet Take 1 tablet (600 mg total) by mouth every 6 (six) hours. Patient not taking: Reported on 10/10/2020 04/09/12   Rendall Males, MD    Allergies: Patient has no known allergies.    Review of Systems  Gastrointestinal:  Positive for abdominal pain.  All other systems reviewed and are negative.   Updated Vital Signs BP 115/71   Pulse 62   Temp 97.9 F (36.6 C) (Oral)   Resp 14   SpO2 100%   Physical Exam Vitals and nursing note reviewed.  Constitutional:      General: She is not in acute distress.    Appearance: Normal appearance.  HENT:     Head: Normocephalic and atraumatic.     Mouth/Throat:     Mouth: Mucous membranes are moist.     Pharynx: Oropharynx is clear.  Eyes:     Extraocular Movements: Extraocular movements intact.     Conjunctiva/sclera: Conjunctivae normal.     Pupils: Pupils are equal, round, and reactive to  light.  Cardiovascular:     Rate and Rhythm: Normal rate and regular rhythm.     Pulses: Normal pulses.     Heart sounds: Normal heart sounds. No murmur heard.    No friction rub. No gallop.  Pulmonary:     Effort: Pulmonary effort is normal.     Breath sounds: Normal breath sounds.  Abdominal:     General: Abdomen is flat. Bowel sounds are normal.     Palpations: Abdomen is soft. There is no hepatomegaly or splenomegaly.     Tenderness: There is abdominal tenderness in the right lower quadrant and suprapubic area. There is no right CVA tenderness or left CVA tenderness. Negative signs include Rovsing's sign, McBurney's sign and psoas sign.  Musculoskeletal:        General: Normal range of motion.     Cervical back: Normal range of motion and neck supple.     Right lower leg: No edema.     Left lower leg: No edema.  Skin:    General: Skin is warm and dry.     Capillary Refill: Capillary refill takes less than 2 seconds.  Neurological:     General: No focal deficit present.     Mental Status: She is alert. Mental status is at baseline.  Psychiatric:  Mood and Affect: Mood normal.     (all labs ordered are listed, but only abnormal results are displayed) Labs Reviewed  COMPREHENSIVE METABOLIC PANEL WITH GFR - Abnormal; Notable for the following components:      Result Value   Glucose, Bld 129 (*)    Calcium 8.7 (*)    All other components within normal limits  URINALYSIS, ROUTINE W REFLEX MICROSCOPIC - Abnormal; Notable for the following components:   APPearance CLOUDY (*)    Hgb urine dipstick LARGE (*)    Protein, ur 30 (*)    Nitrite POSITIVE (*)    Leukocytes,Ua SMALL (*)    Bacteria, UA MANY (*)    All other components within normal limits  LIPASE, BLOOD  CBC  HCG, SERUM, QUALITATIVE    EKG: None  Radiology: No results found.   Procedures   Medications Ordered in the ED - No data to display                                  Medical Decision  Making Amount and/or Complexity of Data Reviewed Labs: ordered.  Risk Prescription drug management.   Medical Decision Making:   Teresa Ramos is a 43 y.o. female who presented to the ED today with right lower quadrant discomfort detailed above.     Complete initial physical exam performed, notably the patient  was awake alert in no apparent distress.  Only notable finding is a mild point tenderness to the right lower quadrant and suprapubic region..    Reviewed and confirmed nursing documentation for past medical history, family history, social history.    Initial Assessment:   With the patient's presentation of right lower quadrant pain, differential diagnosis includes appendicitis, diverticulitis, inflammatory bowel disease, cystitis, pyelonephritis, uro/nephrolithiasis.     Initial Plan:  After assessing this patient along with review and interpretation of the objective lab data, will defer imaging at this time. Screening labs including CBC and Metabolic panel to evaluate for infectious or metabolic etiology of disease.  Urinalysis with reflex culture ordered to evaluate for UTI or relevant urologic/nephrologic pathology.  Objective evaluation as below reviewed   Initial Study Results:   Laboratory  All laboratory results reviewed without evidence of clinically relevant pathology.   Exceptions include: Nitrates are positive as are leukocyte esterase and there is proteinuria noted.   Reassessment and Plan:   Processing this patient along with a objective data review, believe this patient's symptoms are consistent with simple cystitis.  As such we will prescribe patient a course of Keflex , have patient follow-up with primary care if symptoms fail to clear within the next several days.  There is no CVA tenderness, abdomen is grossly nontender except for mild tenderness to the right lower quadrant and to the suprapubic area which again is consistent with simple cystitis.  There is no  leukocytosis either.  As such we will continue to manage for simple cystitis, with outpatient follow-up as necessary.       Final diagnoses:  Acute cystitis without hematuria    ED Discharge Orders          Ordered    cephALEXin  (KEFLEX ) 500 MG capsule  4 times daily        07/28/23 1336               Myriam Dorn BROCKS, [place] 07/28/23 1400    Cottie Donnice PARAS, MD 07/28/23 [phone]

## 2023-07-28 NOTE — ED Triage Notes (Signed)
 Pt reports with RLQ aching pain x 30 mins with nausea.
